# Patient Record
Sex: Male | Born: 1995 | Race: Black or African American | Hispanic: No | Marital: Single | Smoking: Current some day smoker
Health system: Southern US, Community
[De-identification: ages and names within clinical notes are randomized; demographics above are authoritative.]

## PROBLEM LIST (undated history)

## (undated) DIAGNOSIS — Z789 Other specified health status: Secondary | ICD-10-CM

---
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
## 2016-11-05 ENCOUNTER — Observation Stay (HOSPITAL_COMMUNITY)
Admission: EM | Admit: 2016-11-05 | Discharge: 2016-11-06 | Disposition: A | Discharge status: Home / Self Care | Payer: Medicaid Other | Attending: Internal Medicine | Admitting: Internal Medicine

## 2016-11-05 ENCOUNTER — Emergency Department (HOSPITAL_COMMUNITY): Payer: Medicaid Other

## 2016-11-05 ENCOUNTER — Encounter (HOSPITAL_COMMUNITY): Payer: Self-pay | Admitting: *Deleted

## 2016-11-05 DIAGNOSIS — F1910 Other psychoactive substance abuse, uncomplicated: Secondary | ICD-10-CM

## 2016-11-05 DIAGNOSIS — F1190 Opioid use, unspecified, uncomplicated: Secondary | ICD-10-CM | POA: Diagnosis present

## 2016-11-05 DIAGNOSIS — I959 Hypotension, unspecified: Secondary | ICD-10-CM | POA: Diagnosis present

## 2016-11-05 DIAGNOSIS — T50901A Poisoning by unspecified drugs, medicaments and biological substances, accidental (unintentional), initial encounter: Secondary | ICD-10-CM | POA: Diagnosis not present

## 2016-11-05 DIAGNOSIS — F1210 Cannabis abuse, uncomplicated: Secondary | ICD-10-CM

## 2016-11-05 DIAGNOSIS — R4182 Altered mental status, unspecified: Secondary | ICD-10-CM

## 2016-11-05 DIAGNOSIS — I952 Hypotension due to drugs: Secondary | ICD-10-CM | POA: Diagnosis not present

## 2016-11-05 LAB — CBC WITH DIFFERENTIAL/PLATELET
Basophils Absolute: 0 10*3/uL (ref 0.0–0.1)
Basophils Relative: 0 %
EOS PCT: 2 %
Eosinophils Absolute: 0.1 10*3/uL (ref 0.0–0.7)
HEMATOCRIT: 41 % (ref 39.0–52.0)
Hemoglobin: 14.2 g/dL (ref 13.0–17.0)
LYMPHS PCT: 26 %
Lymphs Abs: 1.5 10*3/uL (ref 0.7–4.0)
MCH: 32.1 pg (ref 26.0–34.0)
MCHC: 34.6 g/dL (ref 30.0–36.0)
MCV: 92.8 fL (ref 78.0–100.0)
MONO ABS: 0.5 10*3/uL (ref 0.1–1.0)
MONOS PCT: 8 %
NEUTROS ABS: 3.9 10*3/uL (ref 1.7–7.7)
Neutrophils Relative %: 64 %
Platelets: 164 10*3/uL (ref 150–400)
RBC: 4.42 MIL/uL (ref 4.22–5.81)
RDW: 12.2 % (ref 11.5–15.5)
WBC: 6 10*3/uL (ref 4.0–10.5)

## 2016-11-05 LAB — RAPID URINE DRUG SCREEN, HOSP PERFORMED
Amphetamines: NOT DETECTED
BARBITURATES: NOT DETECTED
Benzodiazepines: NOT DETECTED
Cocaine: NOT DETECTED
Opiates: POSITIVE — AB
TETRAHYDROCANNABINOL: POSITIVE — AB

## 2016-11-05 LAB — COMPREHENSIVE METABOLIC PANEL
ALBUMIN: 4.1 g/dL (ref 3.5–5.0)
ALT: 13 U/L — AB (ref 17–63)
AST: 15 U/L (ref 15–41)
Alkaline Phosphatase: 43 U/L (ref 38–126)
Anion gap: 6 (ref 5–15)
BILIRUBIN TOTAL: 0.3 mg/dL (ref 0.3–1.2)
BUN: 9 mg/dL (ref 6–20)
CHLORIDE: 105 mmol/L (ref 101–111)
CO2: 28 mmol/L (ref 22–32)
CREATININE: 0.87 mg/dL (ref 0.61–1.24)
Calcium: 8.7 mg/dL — ABNORMAL LOW (ref 8.9–10.3)
GFR calc Af Amer: >60 mL/min (ref >60)
GFR calc non Af Amer: >60 mL/min (ref >60)
GLUCOSE: 89 mg/dL (ref 65–99)
POTASSIUM: 3.6 mmol/L (ref 3.5–5.1)
Sodium: 139 mmol/L (ref 135–145)
Total Protein: 6.5 g/dL (ref 6.5–8.1)

## 2016-11-05 LAB — I-STAT CHEM 8, ED
BUN: 9 mg/dL (ref 6–20)
CALCIUM ION: 1.14 mmol/L — AB (ref 1.15–1.40)
Chloride: 103 mmol/L (ref 101–111)
Creatinine, Ser: 0.8 mg/dL (ref 0.61–1.24)
Glucose, Bld: 92 mg/dL (ref 65–99)
HCT: 41 % (ref 39.0–52.0)
HEMOGLOBIN: 13.9 g/dL (ref 13.0–17.0)
Potassium: 4.1 mmol/L (ref 3.5–5.1)
SODIUM: 141 mmol/L (ref 135–145)
TCO2: 31 mmol/L (ref 0–100)

## 2016-11-05 LAB — ACETAMINOPHEN LEVEL: Acetaminophen (Tylenol), Serum: <10 ug/mL — ABNORMAL LOW (ref 10–30)

## 2016-11-05 LAB — ETHANOL: Alcohol, Ethyl (B): <5 mg/dL (ref <5)

## 2016-11-05 LAB — I-STAT CG4 LACTIC ACID, ED: LACTIC ACID, VENOUS: 1 mmol/L (ref 0.5–1.9)

## 2016-11-05 LAB — SALICYLATE LEVEL

## 2016-11-05 MED ORDER — SODIUM CHLORIDE 0.9 % IV BOLUS (SEPSIS)
1000.0000 mL | Freq: Once | INTRAVENOUS | Status: AC
  Administered 2016-11-05: 1000 mL via INTRAVENOUS

## 2016-11-05 MED ORDER — NALOXONE HCL 2 MG/2ML IJ SOSY
PREFILLED_SYRINGE | INTRAMUSCULAR | Status: AC
  Administered 2016-11-05: 1 mg via INTRAVENOUS
  Filled 2016-11-05: qty 2

## 2016-11-05 MED ORDER — NALOXONE HCL 2 MG/2ML IJ SOSY
PREFILLED_SYRINGE | INTRAMUSCULAR | Status: AC
  Filled 2016-11-05: qty 2

## 2016-11-05 MED ORDER — ONDANSETRON HCL 4 MG/2ML IJ SOLN
INTRAMUSCULAR | Status: AC
  Filled 2016-11-05: qty 2

## 2016-11-05 MED ORDER — NALOXONE HCL 2 MG/2ML IJ SOSY
1.0000 mg | PREFILLED_SYRINGE | Freq: Once | INTRAMUSCULAR | Status: AC
  Administered 2016-11-05: 1 mg via INTRAVENOUS

## 2016-11-05 MED ORDER — NALOXONE HCL 2 MG/2ML IJ SOSY: 1.0000 mg | PREFILLED_SYRINGE | Freq: Once | INTRAMUSCULAR | Status: DC

## 2016-11-05 MED ORDER — ONDANSETRON HCL 4 MG/2ML IJ SOLN
4.0000 mg | Freq: Once | INTRAMUSCULAR | Status: AC
  Administered 2016-11-05: 4 mg via INTRAVENOUS

## 2016-11-05 MED ORDER — SODIUM CHLORIDE 0.9 % IV BOLUS (SEPSIS)
500.0000 mL | Freq: Once | INTRAVENOUS | Status: AC
  Administered 2016-11-05: 500 mL via INTRAVENOUS

## 2016-11-05 MED ORDER — NALOXONE HCL 2 MG/2ML IJ SOSY
2.0000 mg | PREFILLED_SYRINGE | Freq: Once | INTRAMUSCULAR | Status: AC
  Administered 2016-11-05: 2 mg via INTRAVENOUS
  Filled 2016-11-05: qty 2

## 2016-11-05 NOTE — H&P (Signed)
History and Physical    Anthony May NOI:[id number] DOB: 10/25/1995 DOA: 11/05/2016  PCP: Patient, No Pcp Per  Patient coming from:  Home.    Chief Complaint:   Accidental drug overdose.  HPI: Anthony May is an 21 y.o. male brought in via EMS unresponsive.  Mother reported that his friends brought him to her and thought he needs to "sleep it off".   In the ER, he was watched for several hours, and intermittently he would wake up transiently for a few minutes, confused, and was surprised when he was told of what had happened.  He denied suicidal overdose.  His BP was noted to be soft.  Narcan 0.4 mg was given with minimal response.  He maintained his Sat and RR, but BP was soft.  Work up included a normal electrolytes, UDS positive for opiates and THC, tylenol and ASA was negative.  Alcohol level was negative.  Hospitalist was asked to admit him for further observation.    ED Course:  See above.  Rewiew of Systems:   Unable.   Past Medical History:  Diagnosis Date  . Medical history non-contributory     Past Surgical History:  Procedure Laterality Date  . CLOSED REDUCTION MANDIBLE WITH MANDIBULOMA N/A 10/07/2015   Procedure: Shelbie Ammons  FIXATION;  Surgeon: Osborn Coho, MD;  Location: [facility] OR;  Service: ENT;  Laterality: N/A;  . MANDIBULAR HARDWARE REMOVAL N/A 11/17/2015   Procedure: MANDIBULAR HARDWARE REMOVAL, removal MMF wires and screws;  Surgeon: Osborn Coho, MD;  Location: [facility];  Service: ENT;  Laterality: N/A;  . ORIF MANDIBULAR FRACTURE N/A 09/16/2015   Procedure: OPEN REDUCTION INTERNAL FIXATION (ORIF) MANDIBULAR FRACTURE;  Surgeon: Osborn Coho, MD;  Location: [facility] OR;  Service: ENT;  Laterality: N/A;     reports that he has been smoking Cigarettes.  He has never used smokeless tobacco. He reports that he does not drink alcohol or use drugs.  No Known Allergies  Family History  Problem Relation Age of Onset  . Adopted: Yes  . Renal  Disease Father      Prior to Admission medications   Not on File    Physical Exam: Vitals:   11/05/16 1730 11/05/16 1800 11/05/16 1916 11/05/16 1919  BP: (!) 94/43 105/61  (!) 98/52  Pulse: 71 (!) 107  66  Resp: (!) 7 18  12   Temp:      TempSrc:      SpO2: 100% 98% 100% 100%  Weight:      Height:          Constitutional: NAD, calm, comfortable Vitals:   11/05/16 1730 11/05/16 1800 11/05/16 1916 11/05/16 1919  BP: (!) 94/43 105/61  (!) 98/52  Pulse: 71 (!) 107  66  Resp: (!) 7 18  12   Temp:      TempSrc:      SpO2: 100% 98% 100% 100%  Weight:      Height:       Eyes: PERRL, lids and conjunctivae normal ENMT: Mucous membranes are moist. Posterior pharynx clear of any exudate or lesions.Normal dentition.  Neck: normal, supple, no masses, no thyromegaly Respiratory: clear to auscultation bilaterally, no wheezing, no crackles. Normal respiratory effort. No accessory muscle use.  Cardiovascular: Regular rate and rhythm, no murmurs / rubs / gallops. No extremity edema. 2+ pedal pulses. No carotid bruits.  Abdomen: no tenderness, no masses palpated. No hepatosplenomegaly. Bowel sounds positive.  Musculoskeletal: no clubbing / cyanosis. No joint deformity upper  and lower extremities. Good ROM, no contractures. Normal muscle tone.  Skin: no rashes, lesions, ulcers. No induration Neurologic: lethargic.  Intermittently wake up, then fall back to sleep.  Psychiatric: unable.    Labs on Admission: I have personally reviewed following labs and imaging studies  CBC:  Recent Labs Lab 11/05/16 1444 11/05/16 1501  WBC 6.0  --   NEUTROABS 3.9  --   HGB 14.2 13.9  HCT 41.0 41.0  MCV 92.8  --   PLT 164  --    Basic Metabolic Panel:  Recent Labs Lab 11/05/16 1444 11/05/16 1501  NA 139 141  K 3.6 4.1  CL 105 103  CO2 28  --   GLUCOSE 89 92  BUN 9 9  CREATININE 0.87 0.80  CALCIUM 8.7*  --    GFR: Estimated Creatinine Clearance: 98.1 mL/min (by C-G formula based on  SCr of 0.8 mg/dL). Liver Function Tests:  Recent Labs Lab 11/05/16 1444  AST 15  ALT 13*  ALKPHOS 43  BILITOT 0.3  PROT 6.5  ALBUMIN 4.1   Radiological Exams on Admission: Ct Head Wo Contrast  Result Date: 11/05/2016 CLINICAL DATA:  Altered level of consciousness. EXAM: CT HEAD WITHOUT CONTRAST TECHNIQUE: Contiguous axial images were obtained from the base of the skull through the vertex without intravenous contrast. COMPARISON:  None. FINDINGS: Brain: No evidence of acute infarction, hemorrhage, hydrocephalus, extra-axial collection or mass lesion/mass effect. Vascular: No hyperdense vessel or unexpected calcification. Skull: Normal. Negative for fracture or focal lesion. Sinuses/Orbits: No acute finding. Other: None. IMPRESSION: 1. No acute intracranial abnormalities.  Normal brain. Electronically Signed   By: Signa Kell M.D.   On: 11/05/2016 16:15   Dg Chest Portable 1 View  Result Date: 11/05/2016 CLINICAL DATA:  Altered mental status. EXAM: PORTABLE CHEST 1 VIEW COMPARISON:  None. FINDINGS: Monitoring leads overlie the patient. Normal cardiac and mediastinal contours. No consolidative pulmonary opacities. No pleural effusion or pneumothorax. IMPRESSION: No acute cardiopulmonary process. Electronically Signed   By: Annia Belt M.D.   On: 11/05/2016 14:55    EKG: Independently reviewed.   Assessment/Plan Principal Problem:   Accidental drug overdose Active Problems:   Hypotension   PLAN:   Accidental drug overdose:  I suspect he uses a long acting narcotics, or was exposed to an adulterant with sedative side effect.  Will give a higher dose of Narcan, and admit him to the SDU for further observation.  Give IVF.  Will give supportive care, and pressor if required.  History did not suggest intentional overdose.     DVT prophylaxis: SCD.  Code Status:  FULL CODE Family Communication: None at bedside.  Disposition Plan: Home.  Consults called: None  Admission status: OBS  in ICU>    Melaysia Streed MD FACP. [facility]  If 7PM-7AM, please contact night-coverage [email or web address] Password [facility]  11/05/2016, 9:26 PM

## 2016-11-05 NOTE — ED Notes (Addendum)
Pt occasionally moves in the bed and opens his eyes. Pt won't talk to nurse at this time.

## 2016-11-05 NOTE — ED Notes (Signed)
ED Provider at bedside. 

## 2016-11-05 NOTE — ED Triage Notes (Signed)
Pt was dropped off at mothers house and laid patient on the bed. Told them,  "he needed to walk it off." Friends left, pt wouldn't respond to mother so she called to ems. Pt is not alert at this time. EDP Pickering at bedside.

## 2016-11-05 NOTE — ED Provider Notes (Addendum)
Pt seen by Dr Rubin Payor earlier.  Presents with probable intoxication from some illicit drug.  Drug screen is positive for opiates and marijuana.  No response to narcan earlier.  Will continue to monitor.  Pt appears stable.   Linwood Dibbles, MD 11/05/16 1633  Still very somnolent.  Responds to painful stimuli but does not speak. BP is borderline.  Will try a dose of narcan.   Linwood Dibbles, MD 11/05/16 1738  Pt did have much of a response to narcan.  He still has not woken up and spoken to me.  He will grimace and roll over when I do a sternal rub.  BP remains soft.  I will consult with medical service for admission, continued monitoring.   Linwood Dibbles, MD 11/05/16 2019  Pt now will wake up after speaking to him loudly for several minutes.  He answered a few questions.  Denies using drugs but then goes back to sleep. Discussed with Dr Nedra Hai about admission. We will try watching him for a few hours longer since he does wake up.   Linwood Dibbles, MD 11/05/16 2039

## 2016-11-05 NOTE — ED Notes (Signed)
Tried to arouse patient, patient will awake for a few seconds and dose back off to sleep.

## 2016-11-05 NOTE — ED Notes (Signed)
MD made aware about low BP.

## 2016-11-05 NOTE — ED Notes (Signed)
Pt vomited several times. Pt had eyes open and would make eye contact when vomiting. Pt was suctioned several times. Airway was patent throughout time.    Mother is at bedside

## 2016-11-05 NOTE — ED Provider Notes (Signed)
[facility]-EMERGENCY DEPT Provider Note   CSN: [id number] Arrival date & time: 11/05/16  1435     History   Chief Complaint Chief Complaint  Patient presents with  . Loss of Consciousness    HPI Anthony May is a 21 y.o. male. Level 5 caveat due to altered mental status. HPI Patient brought in by EMS. Reportedly she was dropped off at his mother's house by 2 friends saying he needed to "sleep it off". Patient is minimally responsive. Unknown what he drank or took. Vitals reassuring but has decreased responsiveness.   Past Medical History:  Diagnosis Date  . Medical history non-contributory     Patient Active Problem List   Diagnosis Date Noted  . Mandible fracture (HCC) 09/16/2015    Past Surgical History:  Procedure Laterality Date  . CLOSED REDUCTION MANDIBLE WITH MANDIBULOMA N/A 10/07/2015   Procedure: Shelbie Ammons  FIXATION;  Surgeon: Osborn Coho, MD;  Location: [facility] OR;  Service: ENT;  Laterality: N/A;  . MANDIBULAR HARDWARE REMOVAL N/A 11/17/2015   Procedure: MANDIBULAR HARDWARE REMOVAL, removal MMF wires and screws;  Surgeon: Osborn Coho, MD;  Location: [facility];  Service: ENT;  Laterality: N/A;  . ORIF MANDIBULAR FRACTURE N/A 09/16/2015   Procedure: OPEN REDUCTION INTERNAL FIXATION (ORIF) MANDIBULAR FRACTURE;  Surgeon: Osborn Coho, MD;  Location: [facility] OR;  Service: ENT;  Laterality: N/A;       Home Medications    Prior to Admission medications   Not on File    Family History Family History  Problem Relation Age of Onset  . Adopted: Yes  . Renal Disease Father     Social History Social History  Substance Use Topics  . Smoking status: Current Some Day Smoker    Types: Cigarettes  . Smokeless tobacco: Never Used  . Alcohol use No     Allergies   Patient has no known allergies.   Review of Systems Review of Systems  Unable to perform ROS: Mental status change     Physical Exam Updated Vital Signs BP 127/87 (BP  Location: Left Arm)   Pulse 97   Temp 99.2 F (37.3 C) (Rectal)   Resp 18   Ht 5\' 5"  (1.651 m)   Wt 46.7 kg (103 lb)   SpO2 100%   BMI 17.14 kg/m   Physical Exam  Constitutional: He appears well-developed.  HENT:  Head: Atraumatic.  Eyes:  Pupils midpoint. Somewhat sluggish. Does have some repetitive movements looking to left and right corneal reflexes intact.  Neck: Neck supple.  Cardiovascular: Normal rate.   Pulmonary/Chest: No respiratory distress.  Abdominal: There is no tenderness.  Musculoskeletal: He exhibits no tenderness.  Neurological:  Patient is nonresponsive and nonverbal. Repetitive eye movements but don't seizing. Will withdraw to pain on his upper extremities. Breathing spontaneously. Would not allow tongue depressor into his posterior pharynx and remained clamped down on it. Has clonus in bilateral ankles.  Skin: Skin is warm.     ED Treatments / Results  Labs (all labs ordered are listed, but only abnormal results are displayed) Labs Reviewed  I-STAT CHEM 8, ED - Abnormal; Notable for the following:       Result Value   Calcium, Ion 1.14 (*)    All other components within normal limits  COMPREHENSIVE METABOLIC PANEL  ETHANOL  CBC WITH DIFFERENTIAL/PLATELET  ACETAMINOPHEN LEVEL  RAPID URINE DRUG SCREEN, HOSP PERFORMED  SALICYLATE LEVEL  I-STAT CG4 LACTIC ACID, ED    EKG  EKG Interpretation  Date/Time:  Monday November 05 2016 14:43:14 EDT Ventricular Rate:  89 PR Interval:    QRS Duration: 92 QT Interval:  329 QTC Calculation: 401 R Axis:   9 Text Interpretation:  Sinus rhythm RSR' in V1 or V2, probably normal variant Confirmed by Benjiman Core [phone]) on 11/05/2016 2:53:06 PM       Radiology Dg Chest Portable 1 View  Result Date: 11/05/2016 CLINICAL DATA:  Altered mental status. EXAM: PORTABLE CHEST 1 VIEW COMPARISON:  None. FINDINGS: Monitoring leads overlie the patient. Normal cardiac and mediastinal contours. No consolidative  pulmonary opacities. No pleural effusion or pneumothorax. IMPRESSION: No acute cardiopulmonary process. Electronically Signed   By: Annia Belt M.D.   On: 11/05/2016 14:55    Procedures Procedures (including critical care time)  Medications Ordered in ED Medications  sodium chloride 0.9 % bolus 500 mL (0 mLs Intravenous Stopped 11/05/16 1525)  naloxone (NARCAN) injection 1 mg (1 mg Intravenous Given 11/05/16 1454)  ondansetron (ZOFRAN) injection 4 mg (4 mg Intravenous Given 11/05/16 1526)     Initial Impression / Assessment and Plan / ED Course  I have reviewed the triage vital signs and the nursing notes.  Pertinent labs & imaging results that were available during my care of the patient were reviewed by me and considered in my medical decision making (see chart for details).     Patient altered mental status. May be substance related. Breathing spontaneously but did and no vomiting. No response to Narcan. Labs pending. Discussed with mother and unknown if there was an ingestion. Informed her possibility of needing intubation of airway protection comes warfarin issue. Care turned over to Dr.Knapp.  Final Clinical Impressions(s) / ED Diagnoses   Final diagnoses:  Altered mental status, unspecified altered mental status type    New Prescriptions New Prescriptions   No medications on file     Benjiman Core, MD 11/05/16 1527

## 2016-11-05 NOTE — ED Notes (Signed)
Pt's mother left contact number:  Luster Landsberg  [phone]

## 2016-11-05 NOTE — ED Notes (Signed)
Pt had no reaction to narcan.

## 2016-11-05 NOTE — ED Notes (Signed)
Pt awake for a period of approx 2 minutes. Pt verbalized need for a urinal. VS stable.

## 2016-11-06 DIAGNOSIS — F1190 Opioid use, unspecified, uncomplicated: Secondary | ICD-10-CM | POA: Diagnosis not present

## 2016-11-06 DIAGNOSIS — F1910 Other psychoactive substance abuse, uncomplicated: Secondary | ICD-10-CM | POA: Diagnosis not present

## 2016-11-06 DIAGNOSIS — I952 Hypotension due to drugs: Secondary | ICD-10-CM | POA: Diagnosis not present

## 2016-11-06 DIAGNOSIS — F1210 Cannabis abuse, uncomplicated: Secondary | ICD-10-CM

## 2016-11-06 DIAGNOSIS — R4182 Altered mental status, unspecified: Secondary | ICD-10-CM | POA: Diagnosis not present

## 2016-11-06 LAB — COMPREHENSIVE METABOLIC PANEL
ALK PHOS: 41 U/L (ref 38–126)
ALT: 50 U/L (ref 17–63)
AST: 40 U/L (ref 15–41)
Albumin: 3.2 g/dL — ABNORMAL LOW (ref 3.5–5.0)
Anion gap: 3 — ABNORMAL LOW (ref 5–15)
BUN: 8 mg/dL (ref 6–20)
CALCIUM: 8.1 mg/dL — AB (ref 8.9–10.3)
CO2: 28 mmol/L (ref 22–32)
CREATININE: 0.8 mg/dL (ref 0.61–1.24)
Chloride: 109 mmol/L (ref 101–111)
Glucose, Bld: 105 mg/dL — ABNORMAL HIGH (ref 65–99)
Potassium: 3.6 mmol/L (ref 3.5–5.1)
SODIUM: 140 mmol/L (ref 135–145)
Total Bilirubin: 0.5 mg/dL (ref 0.3–1.2)
Total Protein: 5.2 g/dL — ABNORMAL LOW (ref 6.5–8.1)

## 2016-11-06 LAB — CBC
HCT: 37.3 % — ABNORMAL LOW (ref 39.0–52.0)
Hemoglobin: 12.9 g/dL — ABNORMAL LOW (ref 13.0–17.0)
MCH: 32 pg (ref 26.0–34.0)
MCHC: 34.6 g/dL (ref 30.0–36.0)
MCV: 92.6 fL (ref 78.0–100.0)
PLATELETS: 154 10*3/uL (ref 150–400)
RBC: 4.03 MIL/uL — AB (ref 4.22–5.81)
RDW: 12.4 % (ref 11.5–15.5)
WBC: 5.3 10*3/uL (ref 4.0–10.5)

## 2016-11-06 LAB — MRSA PCR SCREENING: MRSA by PCR: NEGATIVE

## 2016-11-06 MED ORDER — DEXTROSE-NACL 5-0.9 % IV SOLN
INTRAVENOUS | Status: DC
  Administered 2016-11-06: 08:00:00 via INTRAVENOUS

## 2016-11-06 NOTE — Discharge Summary (Signed)
Physician Discharge Summary  Anthony May ZOX:[id number] DOB: [birth year] DOA: 11/05/2016  PCP: Patient, No Pcp Per  Admit date: 11/05/2016 Discharge date: 11/06/2016  Admitted From: (Home) Disposition:  (Home)  Recommendations for Outpatient Follow-up:  1. Follow up with PCP as needed . 2.   Home Health: (NO) Equipment/Devices:(none)  Discharge Condition:(Stable)  CODE STATUS (FULL)  Diet recommendation: regular    Brief/Interim Summary:   21 y.o. male brought in via EMS unresponsive.  Mother reported that his friends brought him to her and thought he needs to "sleep it off".   In the ER, he was watched for several hours, and intermittently he would wake up transiently for a few minutes, confused, and was surprised when he was told of what had happened.  He denied suicidal overdose.  His BP was noted to be soft.  Narcan 0.4 mg was given with minimal response.  He maintained his Sat and RR, but BP was soft.  Work up included a normal electrolytes, UDS positive for opiates and THC, tylenol and ASA was negative.  Alcohol level was negative. Admitted to the ICU for close obs with complete resolution of his symptoms ,BP was stabilized ,  stable to be discharged and go home . He was advised about the importance to quit doing the drugs for better outcome .    Discharge Diagnoses:     Accidental drug overdose   Hypotension    Discharge Instructions  Discharge Instructions    Diet general    Complete by:  As directed    Increase activity slowly    Complete by:  As directed      Allergies as of 11/06/2016   No Known Allergies     Medication List    You have not been prescribed any medications.     No Known Allergies  Consultations: None   Procedures/Studies: Ct Head Wo Contrast  Result Date: 11/05/2016 CLINICAL DATA:  Altered level of consciousness. EXAM: CT HEAD WITHOUT CONTRAST TECHNIQUE: Contiguous axial images were obtained from the base of the skull through  the vertex without intravenous contrast. COMPARISON:  None. FINDINGS: Brain: No evidence of acute infarction, hemorrhage, hydrocephalus, extra-axial collection or mass lesion/mass effect. Vascular: No hyperdense vessel or unexpected calcification. Skull: Normal. Negative for fracture or focal lesion. Sinuses/Orbits: No acute finding. Other: None. IMPRESSION: 1. No acute intracranial abnormalities.  Normal brain. Electronically Signed   By: Signa Kell M.D.   On: 11/05/2016 16:15   Dg Chest Portable 1 View  Result Date: 11/05/2016 CLINICAL DATA:  Altered mental status. EXAM: PORTABLE CHEST 1 VIEW COMPARISON:  None. FINDINGS: Monitoring leads overlie the patient. Normal cardiac and mediastinal contours. No consolidative pulmonary opacities. No pleural effusion or pneumothorax. IMPRESSION: No acute cardiopulmonary process. Electronically Signed   By: Annia Belt M.D.   On: 11/05/2016 14:55    (Echo, Carotid, EGD, Colonoscopy, ERCP)    Subjective:   Discharge Exam: Vitals:   11/06/16 0800 11/06/16 0820  BP: 107/80   Pulse: (!) 51 (!) 54  Resp: 18 19  Temp:    SpO2: 100% 99%   Vitals:   11/06/16 0600 11/06/16 0700 11/06/16 0800 11/06/16 0820  BP: 104/64 115/69 107/80   Pulse: (!) 54 60 (!) 51 (!) 54  Resp: 15 16 18 19   Temp:      TempSrc:      SpO2: 98% 100% 100% 99%  Weight:      Height:        General: Pt  is alert, awake, not in acute distress Cardiovascular: RRR, S1/S2 +, no rubs, no gallops Respiratory: CTA bilaterally, no wheezing, no rhonchi Abdominal: Soft, NT, ND, bowel sounds + Extremities: no edema, no cyanosis    The results of significant diagnostics from this hospitalization (including imaging, microbiology, ancillary and laboratory) are listed below for reference.     Microbiology: No results found for this or any previous visit (from the past 240 hour(s)).   Labs: BNP (last 3 results) No results for input(s): BNP in the last 8760 hours. Basic Metabolic  Panel:  Recent Labs Lab 11/05/16 1444 11/05/16 1501 11/06/16 0548  NA 139 141 140  K 3.6 4.1 3.6  CL 105 103 109  CO2 28  --  28  GLUCOSE 89 92 105*  BUN 9 9 8   CREATININE 0.87 0.80 0.80  CALCIUM 8.7*  --  8.1*   Liver Function Tests:  Recent Labs Lab 11/05/16 1444 11/06/16 0548  AST 15 40  ALT 13* 50  ALKPHOS 43 41  BILITOT 0.3 0.5  PROT 6.5 5.2*  ALBUMIN 4.1 3.2*   No results for input(s): LIPASE, AMYLASE in the last 168 hours. No results for input(s): AMMONIA in the last 168 hours. CBC:  Recent Labs Lab 11/05/16 1444 11/05/16 1501 11/06/16 0548  WBC 6.0  --  5.3  NEUTROABS 3.9  --   --   HGB 14.2 13.9 12.9*  HCT 41.0 41.0 37.3*  MCV 92.8  --  92.6  PLT 164  --  154   Cardiac Enzymes: No results for input(s): CKTOTAL, CKMB, CKMBINDEX, TROPONINI in the last 168 hours. BNP: Invalid input(s): POCBNP CBG: No results for input(s): GLUCAP in the last 168 hours. D-Dimer No results for input(s): DDIMER in the last 72 hours. Hgb A1c No results for input(s): HGBA1C in the last 72 hours. Lipid Profile No results for input(s): CHOL, HDL, LDLCALC, TRIG, CHOLHDL, LDLDIRECT in the last 72 hours. Thyroid function studies No results for input(s): TSH, T4TOTAL, T3FREE, THYROIDAB in the last 72 hours.  Invalid input(s): FREET3 Anemia work up No results for input(s): VITAMINB12, FOLATE, FERRITIN, TIBC, IRON, RETICCTPCT in the last 72 hours. Urinalysis No results found for: COLORURINE, APPEARANCEUR, LABSPEC, PHURINE, GLUCOSEU, HGBUR, BILIRUBINUR, KETONESUR, PROTEINUR, UROBILINOGEN, NITRITE, LEUKOCYTESUR Sepsis Labs Invalid input(s): PROCALCITONIN,  WBC,  LACTICIDVEN Microbiology No results found for this or any previous visit (from the past 240 hour(s)).   Time coordinating discharge: Over 30 minutes  SIGNED:   Efrain Sella, MD  [facility] 11/06/2016, 8:54 AM Pager   If 7PM-7AM, please contact night-coverage [email or web address] Password [email or web address]

## 2016-11-06 NOTE — Progress Notes (Signed)
Pt discharged home; instructions given to pt. Understanding verbalized. PIV removed with no complications. VSS. Pt left with all belongings.

## 2016-11-07 LAB — HIV ANTIBODY (ROUTINE TESTING W REFLEX): HIV Screen 4th Generation wRfx: NONREACTIVE

## 2017-09-01 IMAGING — CT CT MAXILLOFACIAL W/O CM
3 series · 16 of 47 positions shown, 19 images · non-contrast
Comparison: None.

CLINICAL DATA: Fell, striking face on a tree stump tonight.

EXAM:
CT MAXILLOFACIAL WITHOUT CONTRAST
TECHNIQUE: Multidetector CT imaging of the maxillofacial structures was
performed. Multiplanar CT image reconstructions were also generated.
A small metallic BB was placed on the right temple in order to
reliably differentiate right from left.

[Series 2: max soft · axial · 0.38mm/px · z∈[+1170,+1300]mm · 10 of 77 slices shown, 13 images]
[im 6/77  brain]
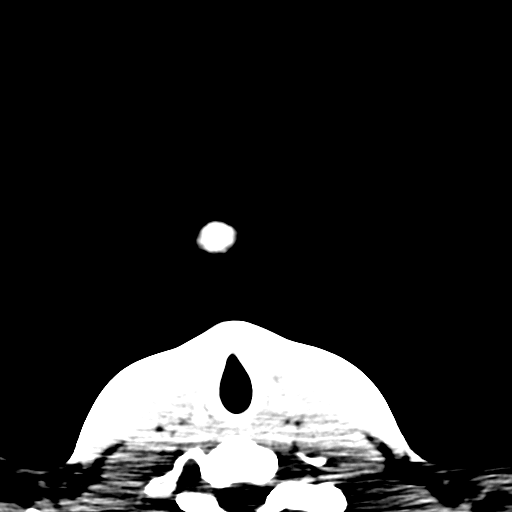
[im 6/77  bone]
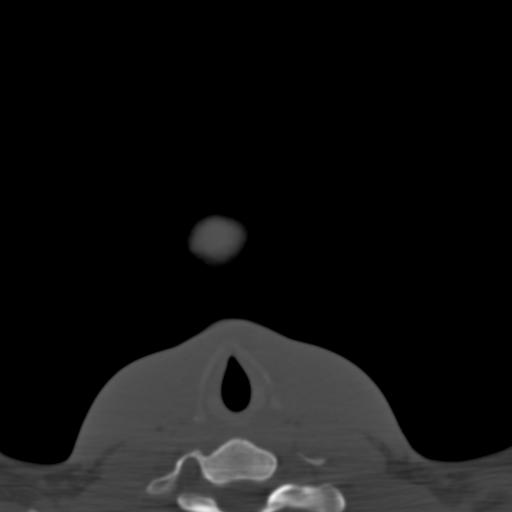
[im 14/77  bone]
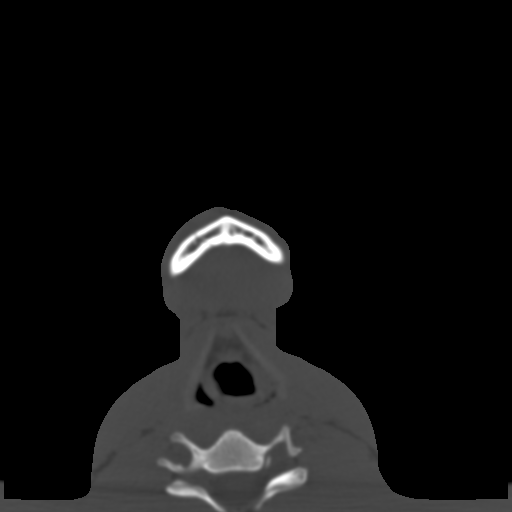
[im 21/77  bone]
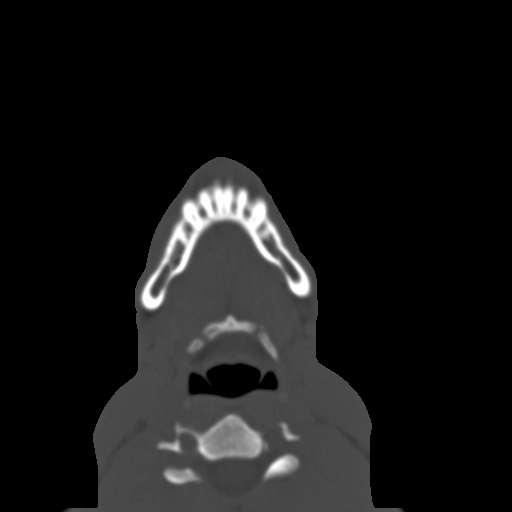
[im 27/77  bone]
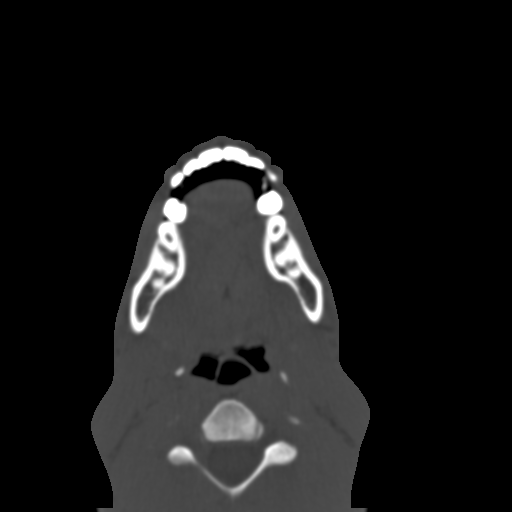
[im 35/77  brain]
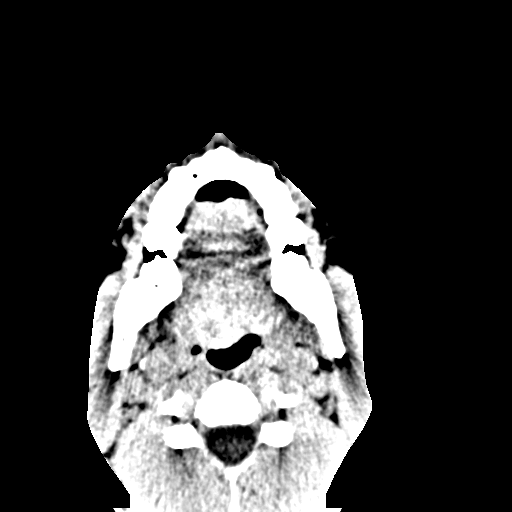
[im 35/77  bone]
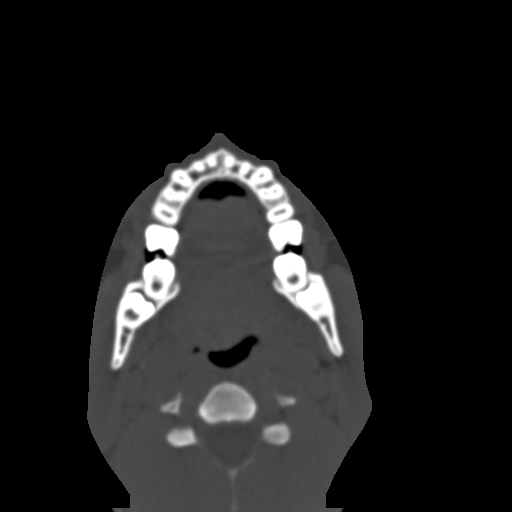
[im 42/77  bone]
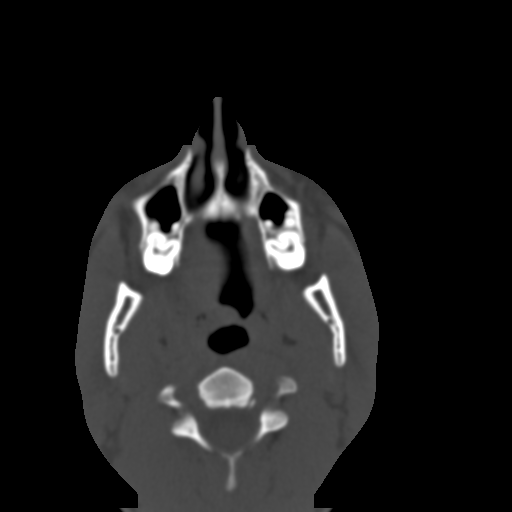
[im 50/77  bone]
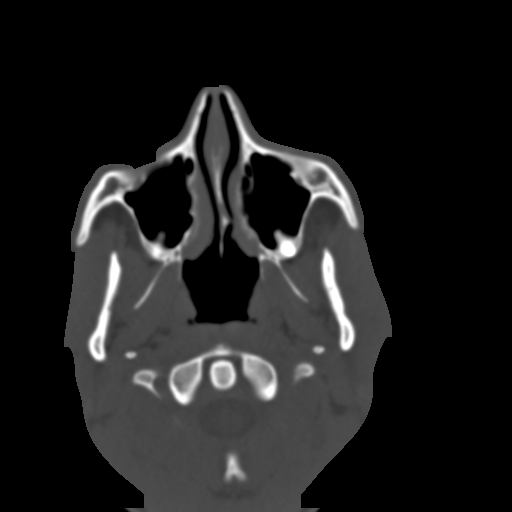
[im 58/77  bone]
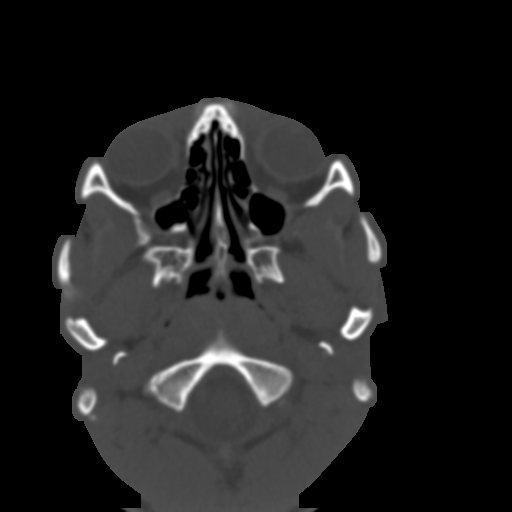
[im 63/77  brain]
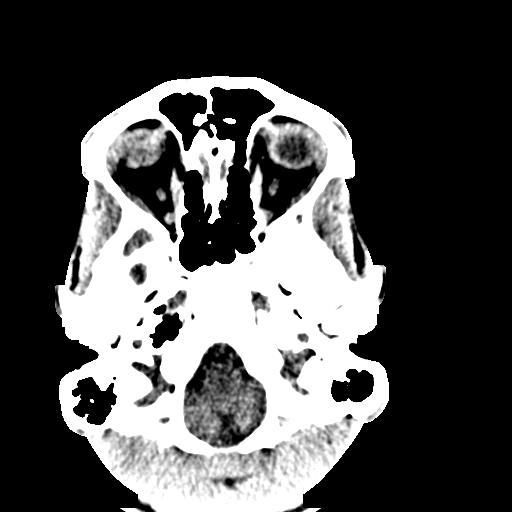
[im 63/77  bone]
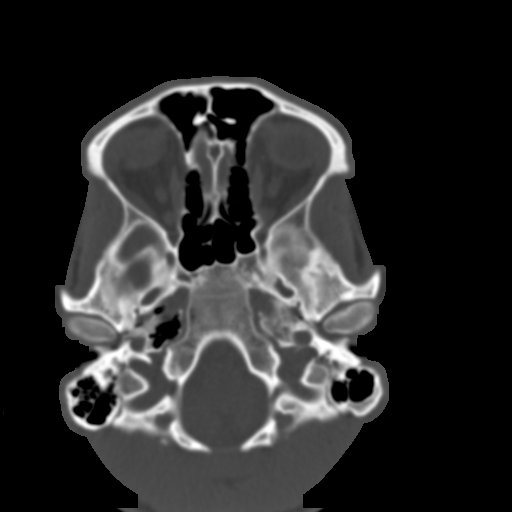
[im 71/77  bone]
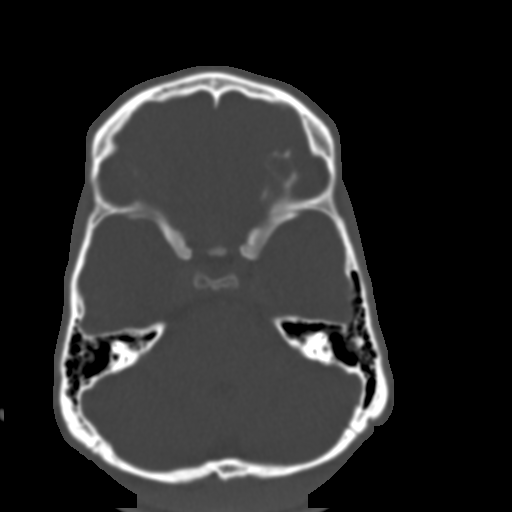

[Series 4: sagittal soft sag · sagittal · 0.32mm/px · 3 of 81 slices shown]
[im 27/81  bone]
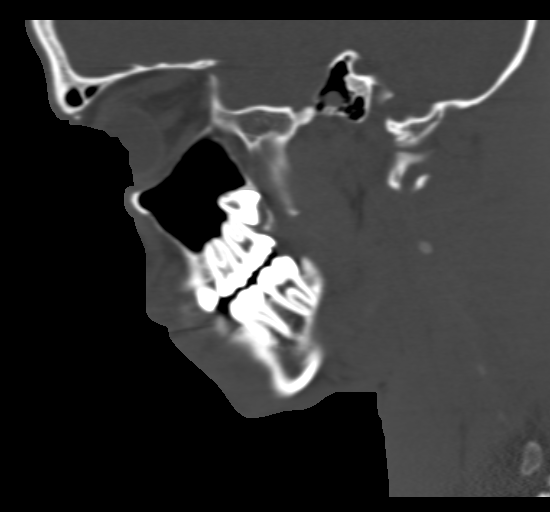
[im 41/81  bone]
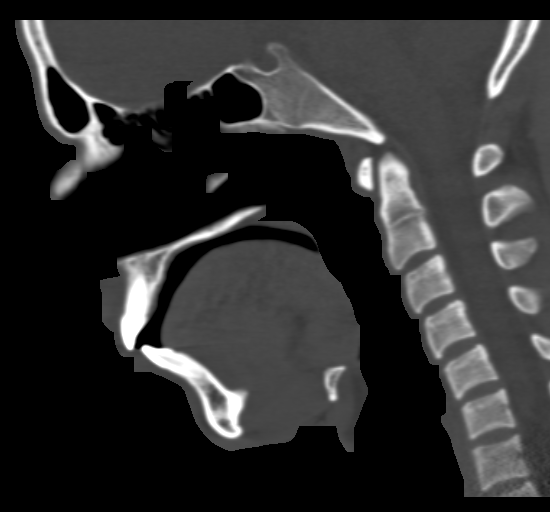
[im 54/81  bone]
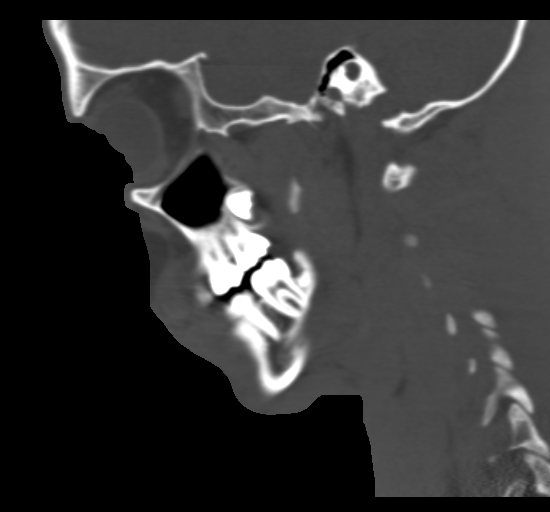

[Series 5: coronal soft cor · coronal · 0.32mm/px · 3 of 81 slices shown]
[im 27/81  bone]
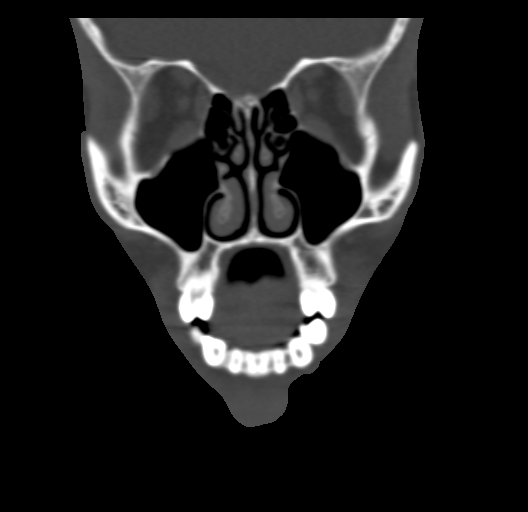
[im 36/81  bone]
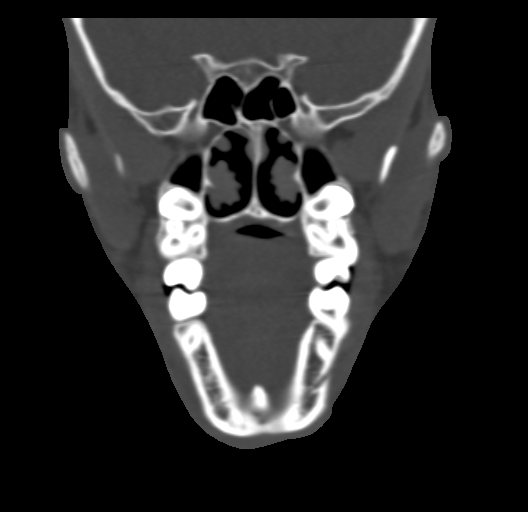
[im 45/81  bone]
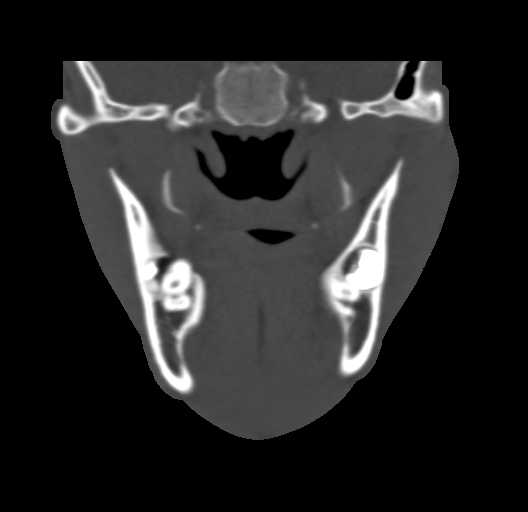

[16 of 47 positions shown; findings below may reference images not displayed]

FINDINGS: There is an acute nondisplaced fracture of the mandible in the right
parasymphyseal region. TMJ articulations are intact. No other acute
fractures are evident. Maxillary sinuses are intact. Orbits are
intact. Pterygoid plates and zygomatic arches are intact.

No soft tissue foreign body or soft tissue gas is evident.
IMPRESSION: Nondisplaced fracture of the right parasymphyseal mandible.

## 2018-01-30 ENCOUNTER — Encounter (HOSPITAL_COMMUNITY): Payer: Self-pay

## 2018-01-30 ENCOUNTER — Other Ambulatory Visit: Payer: Self-pay

## 2018-01-30 ENCOUNTER — Emergency Department (HOSPITAL_COMMUNITY)
Admission: EM | Admit: 2018-01-30 | Discharge: 2018-01-30 | Disposition: A | Discharge status: Home / Self Care | Payer: Self-pay | Attending: Emergency Medicine | Admitting: Emergency Medicine

## 2018-01-30 DIAGNOSIS — F17210 Nicotine dependence, cigarettes, uncomplicated: Secondary | ICD-10-CM | POA: Insufficient documentation

## 2018-01-30 DIAGNOSIS — T50901A Poisoning by unspecified drugs, medicaments and biological substances, accidental (unintentional), initial encounter: Secondary | ICD-10-CM | POA: Insufficient documentation

## 2018-01-30 LAB — CBC WITH DIFFERENTIAL/PLATELET
Abs Immature Granulocytes: 0.07 10*3/uL (ref 0.00–0.07)
BASOS ABS: 0.1 10*3/uL (ref 0.0–0.1)
BASOS PCT: 1 %
EOS ABS: 0 10*3/uL (ref 0.0–0.5)
EOS PCT: 1 %
HCT: 46.2 % (ref 39.0–52.0)
Hemoglobin: 15.7 g/dL (ref 13.0–17.0)
IMMATURE GRANULOCYTES: 1 %
LYMPHS ABS: 1.3 10*3/uL (ref 0.7–4.0)
Lymphocytes Relative: 16 %
MCH: 31 pg (ref 26.0–34.0)
MCHC: 34 g/dL (ref 30.0–36.0)
MCV: 91.1 fL (ref 80.0–100.0)
Monocytes Absolute: 0.4 10*3/uL (ref 0.1–1.0)
Monocytes Relative: 5 %
NRBC: 0 % (ref 0.0–0.2)
Neutro Abs: 6.4 10*3/uL (ref 1.7–7.7)
Neutrophils Relative %: 76 %
PLATELETS: 224 10*3/uL (ref 150–400)
RBC: 5.07 MIL/uL (ref 4.22–5.81)
RDW: 11.9 % (ref 11.5–15.5)
WBC: 8.3 10*3/uL (ref 4.0–10.5)

## 2018-01-30 LAB — COMPREHENSIVE METABOLIC PANEL
ALBUMIN: 4.9 g/dL (ref 3.5–5.0)
ALT: 31 U/L (ref 0–44)
AST: 35 U/L (ref 15–41)
Alkaline Phosphatase: 61 U/L (ref 38–126)
Anion gap: 9 (ref 5–15)
BILIRUBIN TOTAL: 0.7 mg/dL (ref 0.3–1.2)
BUN: 6 mg/dL (ref 6–20)
CHLORIDE: 106 mmol/L (ref 98–111)
CO2: 25 mmol/L (ref 22–32)
Calcium: 9.1 mg/dL (ref 8.9–10.3)
Creatinine, Ser: 0.9 mg/dL (ref 0.61–1.24)
GFR calc Af Amer: >60 mL/min (ref >60)
GFR calc non Af Amer: >60 mL/min (ref >60)
GLUCOSE: 96 mg/dL (ref 70–99)
POTASSIUM: 3.6 mmol/L (ref 3.5–5.1)
SODIUM: 140 mmol/L (ref 135–145)
TOTAL PROTEIN: 8.2 g/dL — AB (ref 6.5–8.1)

## 2018-01-30 LAB — ETHANOL: Alcohol, Ethyl (B): 11 mg/dL — ABNORMAL HIGH (ref <10)

## 2018-01-30 LAB — RAPID URINE DRUG SCREEN, HOSP PERFORMED
AMPHETAMINES: NOT DETECTED
BARBITURATES: NOT DETECTED
BENZODIAZEPINES: NOT DETECTED
Cocaine: POSITIVE — AB
Opiates: POSITIVE — AB
Tetrahydrocannabinol: POSITIVE — AB

## 2018-01-30 NOTE — Discharge Instructions (Addendum)
Do not use any more drugs and [facility] with [facility]

## 2018-01-30 NOTE — ED Provider Notes (Signed)
[facility] EMERGENCY DEPARTMENT Provider Note   CSN: [id number] Arrival date & time: 01/30/18  1847     History   Chief Complaint Chief Complaint  Patient presents with  . Drug Overdose    HPI Anthony May is a 22 y.o. male.  Patient was in the bed with his girlfriend and became unresponsive.  She called 911 because he would not respond.  She stated he was making a gurgling sound with his mouth.  The rescue team arrived they gave him Narcan and he eventually came around.  Patient states he was not trying to hurt himself  The history is provided by the patient and a relative. No language interpreter was used.  Drug Overdose  This is a new problem. The current episode started less than 1 hour ago. The problem occurs rarely. The problem has been resolved. Pertinent negatives include no chest pain, no abdominal pain and no headaches. Nothing aggravates the symptoms. Nothing relieves the symptoms. He has tried nothing for the symptoms. The treatment provided no relief.    Past Medical History:  Diagnosis Date  . Medical history non-contributory     Patient Active Problem List   Diagnosis Date Noted  . Accidental drug overdose 11/05/2016  . Hypotension 11/05/2016  . Mandible fracture (HCC) 09/16/2015    Past Surgical History:  Procedure Laterality Date  . CLOSED REDUCTION MANDIBLE WITH MANDIBULOMA N/A 10/07/2015   Procedure: [name];  Surgeon: Osborn Cohoavid Shoemaker, MD;  [facility];  Service: ENT;  Laterality: N/A;  . MANDIBULAR HARDWARE REMOVAL N/A 11/17/2015   Procedure: MANDIBULAR HARDWARE REMOVAL, removal MMF wires and screws;  Surgeon: Osborn Cohoavid Shoemaker, MD;  Location: [facility];  Service: ENT;  Laterality: N/A;  . ORIF MANDIBULAR FRACTURE N/A 09/16/2015   Procedure: OPEN REDUCTION INTERNAL FIXATION (ORIF) MANDIBULAR FRACTURE;  Surgeon: Osborn Cohoavid Shoemaker, MD;  [facility] OR;  Service: ENT;  Laterality: N/A;        Home Medications      Prior to Admission medications   Not on File    Family History Family History  Adopted: Yes  Problem Relation Age of Onset  . Renal Disease Father     Social History Social History   Tobacco Use  . Smoking status: Current Some Day Smoker    Types: Cigarettes  . Smokeless tobacco: Never Used  Substance Use Topics  . Alcohol use: No  . Drug use: No     Allergies   Patient has no known allergies.   Review of Systems Review of Systems  Constitutional: Negative for appetite change and fatigue.  HENT: Negative for congestion, ear discharge and sinus pressure.   Eyes: Negative for discharge.  Respiratory: Negative for cough.   Cardiovascular: Negative for chest pain.  Gastrointestinal: Negative for abdominal pain and diarrhea.  Genitourinary: Negative for frequency and hematuria.  Musculoskeletal: Negative for back pain.  Skin: Negative for rash.  Neurological: Negative for seizures and headaches.  Psychiatric/Behavioral: Negative for hallucinations.     Physical Exam Updated Vital Signs BP 117/78   Pulse 91   Temp 97.9 F (36.6 C) (Oral)   Resp 13   Ht 5\' 5"  (1.651 m)   Wt 52.2 kg   SpO2 98%   BMI 19.14 kg/m   Physical Exam  Constitutional: He is oriented to person, place, and time. He appears well-developed.  HENT:  Head: Normocephalic.  Eyes: Conjunctivae and EOM are normal. No scleral icterus.  Neck: Neck supple. No thyromegaly present.  Cardiovascular: Normal rate and regular rhythm. Exam reveals no gallop and no friction rub.  No murmur heard. Pulmonary/Chest: No stridor. He has no wheezes. He has no rales. He exhibits no tenderness.  Abdominal: He exhibits no distension. There is no tenderness. There is no rebound.  Musculoskeletal: Normal range of motion. He exhibits no edema.  Lymphadenopathy:    He has no cervical adenopathy.  Neurological: He is oriented to person, place, and time. He exhibits normal muscle tone. Coordination normal.   Skin: No rash noted. No erythema.  Psychiatric: He has a normal mood and affect. His behavior is normal.     ED Treatments / Results  Labs (all labs ordered are listed, but only abnormal results are displayed) Labs Reviewed  COMPREHENSIVE METABOLIC PANEL - Abnormal; Notable for the following components:      Result Value   Total Protein 8.2 (*)    All other components within normal limits  ETHANOL - Abnormal; Notable for the following components:   Alcohol, Ethyl (B) 11 (*)    All other components within normal limits  RAPID URINE DRUG SCREEN, HOSP PERFORMED - Abnormal; Notable for the following components:   Opiates POSITIVE (*)    Cocaine POSITIVE (*)    Tetrahydrocannabinol POSITIVE (*)    All other components within normal limits  CBC WITH DIFFERENTIAL/PLATELET    EKG None  Radiology No results found.  Procedures Procedures (including critical care time)  Medications Ordered in ED Medications - No data to display   Initial Impression / Assessment and Plan / ED Course  I have reviewed the triage vital signs and the nursing notes.  Pertinent labs & imaging results that were available during my care of the patient were reviewed by me and considered in my medical decision making (see chart for details).     Patient with accidental drug overdose.  [facility].  Final Clinical Impressions(s) / ED Diagnoses   Final diagnoses:  Accidental drug overdose, initial encounter    ED Discharge Orders    None       Bethann Berkshire, MD 01/30/18 2141

## 2018-01-30 NOTE — ED Triage Notes (Signed)
Pt brought in via [facility], girlfriend found pt unresponsive, 2 of narcan given intranasally. CPR was initiated. A&O upon arrival to ED, pt states he took one 30mg  roxy and had 1 beer. Cooperative and denies any pain.

## 2018-07-24 ENCOUNTER — Emergency Department (HOSPITAL_COMMUNITY)
Admission: EM | Admit: 2018-07-24 | Discharge: 2018-07-24 | Disposition: A | Discharge status: Home / Self Care | Payer: Self-pay | Attending: Emergency Medicine | Admitting: Emergency Medicine

## 2018-07-24 ENCOUNTER — Other Ambulatory Visit: Payer: Self-pay

## 2018-07-24 ENCOUNTER — Encounter (HOSPITAL_COMMUNITY): Payer: Self-pay

## 2018-07-24 DIAGNOSIS — T50901A Poisoning by unspecified drugs, medicaments and biological substances, accidental (unintentional), initial encounter: Secondary | ICD-10-CM

## 2018-07-24 DIAGNOSIS — T424X1A Poisoning by benzodiazepines, accidental (unintentional), initial encounter: Secondary | ICD-10-CM | POA: Insufficient documentation

## 2018-07-24 LAB — CBC WITH DIFFERENTIAL/PLATELET
Abs Immature Granulocytes: 0.04 10*3/uL (ref 0.00–0.07)
Basophils Absolute: 0 10*3/uL (ref 0.0–0.1)
Basophils Relative: 0 %
Eosinophils Absolute: 0.1 10*3/uL (ref 0.0–0.5)
Eosinophils Relative: 1 %
HCT: 46.7 % (ref 39.0–52.0)
Hemoglobin: 15.6 g/dL (ref 13.0–17.0)
Immature Granulocytes: 0 %
Lymphocytes Relative: 10 %
Lymphs Abs: 1.1 10*3/uL (ref 0.7–4.0)
MCH: 30.6 pg (ref 26.0–34.0)
MCHC: 33.4 g/dL (ref 30.0–36.0)
MCV: 91.6 fL (ref 80.0–100.0)
Monocytes Absolute: 0.6 10*3/uL (ref 0.1–1.0)
Monocytes Relative: 6 %
Neutro Abs: 8.6 10*3/uL — ABNORMAL HIGH (ref 1.7–7.7)
Neutrophils Relative %: 83 %
Platelets: 182 10*3/uL (ref 150–400)
RBC: 5.1 MIL/uL (ref 4.22–5.81)
RDW: 11.9 % (ref 11.5–15.5)
WBC: 10.4 10*3/uL (ref 4.0–10.5)
nRBC: 0 % (ref 0.0–0.2)

## 2018-07-24 LAB — COMPREHENSIVE METABOLIC PANEL
ALT: 45 U/L — ABNORMAL HIGH (ref 0–44)
AST: 34 U/L (ref 15–41)
Albumin: 4.8 g/dL (ref 3.5–5.0)
Alkaline Phosphatase: 51 U/L (ref 38–126)
Anion gap: 10 (ref 5–15)
BUN: 8 mg/dL (ref 6–20)
CO2: 27 mmol/L (ref 22–32)
Calcium: 9.2 mg/dL (ref 8.9–10.3)
Chloride: 104 mmol/L (ref 98–111)
Creatinine, Ser: 0.83 mg/dL (ref 0.61–1.24)
GFR calc Af Amer: >60 mL/min (ref >60)
GFR calc non Af Amer: >60 mL/min (ref >60)
Glucose, Bld: 117 mg/dL — ABNORMAL HIGH (ref 70–99)
Potassium: 4.3 mmol/L (ref 3.5–5.1)
Sodium: 141 mmol/L (ref 135–145)
Total Bilirubin: 0.5 mg/dL (ref 0.3–1.2)
Total Protein: 7.4 g/dL (ref 6.5–8.1)

## 2018-07-24 LAB — RAPID URINE DRUG SCREEN, HOSP PERFORMED
Amphetamines: NOT DETECTED
Barbiturates: NOT DETECTED
Benzodiazepines: POSITIVE — AB
Cocaine: NOT DETECTED
Opiates: POSITIVE — AB
Tetrahydrocannabinol: POSITIVE — AB

## 2018-07-24 LAB — SALICYLATE LEVEL: Salicylate Lvl: <7 mg/dL (ref 2.8–30.0)

## 2018-07-24 LAB — ACETAMINOPHEN LEVEL: Acetaminophen (Tylenol), Serum: <10 ug/mL — ABNORMAL LOW (ref 10–30)

## 2018-07-24 NOTE — Discharge Instructions (Signed)
Please refrain from illegal drugs.  The next time you decide to try drugs for recreation remember it may be your last Social event and someone might not be there to save your life.

## 2018-07-24 NOTE — ED Triage Notes (Signed)
Pt was unconscious with agonal respirations. Bagged by EMS. Given 1mg  of Narcan and pt is now conscious alert and oriented. States he took a half a bar of yellow xanax last night and smoked marijuana. States he woke up this morning, then used the bathroom and went back to sleep. He denies taking anything this morning.

## 2018-07-24 NOTE — ED Provider Notes (Addendum)
[facility] EMERGENCY DEPARTMENT Provider Note   CSN: [id number] Arrival date & time: 07/24/18  4098    History   Chief Complaint Chief Complaint  Patient presents with  . Drug Overdose    HPI Anthony May is a 23 y.o. male.     Patient presents after being found unconscious and agonal respirations.  Patient was bagged by EMS and given 1 mg of Narcan, patient improved to normal consciousness and currently has no symptoms.  Patient states he took half a bar of yellow Xanax last night and smoked marijuana.  Patient recalls waking up this morning to use the bathroom went back to sleep.  Patient denies taking anything this morning.  Patient denies any seizure history.  Patient has no cardiac history.  Patient denies self harm.     Past Medical History:  Diagnosis Date  . Medical history non-contributory     Patient Active Problem List   Diagnosis Date Noted  . Accidental drug overdose 11/05/2016  . Hypotension 11/05/2016  . Mandible fracture (HCC) 09/16/2015    Past Surgical History:  Procedure Laterality Date  . CLOSED REDUCTION MANDIBLE WITH MANDIBULOMA N/A 10/07/2015   Procedure: Shelbie Ammons  FIXATION;  Surgeon: Osborn Coho, MD;  Location: [facility] OR;  Service: ENT;  Laterality: N/A;  . MANDIBULAR HARDWARE REMOVAL N/A 11/17/2015   Procedure: MANDIBULAR HARDWARE REMOVAL, removal MMF wires and screws;  Surgeon: Osborn Coho, MD;  Location: [facility];  Service: ENT;  Laterality: N/A;  . ORIF MANDIBULAR FRACTURE N/A 09/16/2015   Procedure: OPEN REDUCTION INTERNAL FIXATION (ORIF) MANDIBULAR FRACTURE;  Surgeon: Osborn Coho, MD;  Location: [facility] OR;  Service: ENT;  Laterality: N/A;        Home Medications    Prior to Admission medications   Not on File    Family History Family History  Adopted: Yes  Problem Relation Age of Onset  . Renal Disease Father     Social History Social History   Tobacco Use  . Smoking status: Current Some Day  Smoker    Types: Cigarettes  . Smokeless tobacco: Never Used  Substance Use Topics  . Alcohol use: Yes    Comment: Occasional   . Drug use: Yes    Types: Marijuana     Allergies   Patient has no known allergies.   Review of Systems Review of Systems  Constitutional: Negative for chills and fever.  HENT: Negative for congestion.   Eyes: Negative for visual disturbance.  Respiratory: Negative for shortness of breath.   Cardiovascular: Negative for chest pain.  Gastrointestinal: Negative for abdominal pain and vomiting.  Genitourinary: Negative for dysuria and flank pain.  Musculoskeletal: Negative for back pain, neck pain and neck stiffness.  Skin: Negative for rash.  Neurological: Negative for light-headedness and headaches.     Physical Exam Updated Vital Signs BP 127/77   Pulse 86   Temp 97.6 F (36.4 C) (Oral)   Resp 12   Ht  (1.651 m)   Wt 52.2 kg   SpO2 100%   BMI 19.14 kg/m   Physical Exam Vitals signs and nursing note reviewed.  Constitutional:      Appearance: He is well-developed.  HENT:     Head: Normocephalic and atraumatic.  Eyes:     General:        Right eye: No discharge.        Left eye: No discharge.     Conjunctiva/sclera: Conjunctivae normal.  Neck:  Musculoskeletal: Normal range of motion and neck supple.     Trachea: No tracheal deviation.  Cardiovascular:     Rate and Rhythm: Normal rate and regular rhythm.  Pulmonary:     Effort: Pulmonary effort is normal.     Breath sounds: Normal breath sounds.  Abdominal:     General: There is no distension.     Palpations: Abdomen is soft.     Tenderness: There is no abdominal tenderness. There is no guarding.  Skin:    General: Skin is warm.     Findings: No rash.  Neurological:     Mental Status: He is alert and oriented to person, place, and time.     Cranial Nerves: No cranial nerve deficit.      ED Treatments / Results  Labs (all labs ordered are listed, but only  abnormal results are displayed) Labs Reviewed  COMPREHENSIVE METABOLIC PANEL - Abnormal; Notable for the following components:      Result Value   Glucose, Bld 117 (*)    ALT 45 (*)    All other components within normal limits  CBC WITH DIFFERENTIAL/PLATELET - Abnormal; Notable for the following components:   Neutro Abs 8.6 (*)    All other components within normal limits  RAPID URINE DRUG SCREEN, HOSP PERFORMED - Abnormal; Notable for the following components:   Opiates POSITIVE (*)    Benzodiazepines POSITIVE (*)    Tetrahydrocannabinol POSITIVE (*)    All other components within normal limits  ACETAMINOPHEN LEVEL - Abnormal; Notable for the following components:   Acetaminophen (Tylenol), Serum <10 (*)    All other components within normal limits  SALICYLATE LEVEL    EKG EKG Interpretation  Date/Time:  Thursday Jul 24 2018 09:20:40 EDT Ventricular Rate:  94 PR Interval:    QRS Duration: 94 QT Interval:  361 QTC Calculation: 452 R Axis:   58 Text Interpretation:  Sinus rhythm Artifact in lead(s) V3 Confirmed by Blane OharaZavitz, Quindarrius [place] [phone]([id number]) on 07/24/2018 1:20:00 PM   Radiology No results found.  Procedures Procedures (including critical care time)  Medications Ordered in ED Medications - No data to display   Initial Impression / Assessment and Plan / ED Course  I have reviewed the triage vital signs and the nursing notes.  Pertinent labs & imaging results that were available during my care of the patient were reviewed by me and considered in my medical decision making (see chart for details).       Patient presents after suspected accidental drug overdose.  The timing of ingestion is not consistent however patient may have taken a long-acting or had something laced in his marijuana.  Plan for screening blood work, EKG reviewed no acute findings, patient will be observed in the ER and then stressed importance of having someone with him throughout today.  We had a long  discussion about risks of recreational/illegal drugs and the important consequences especially with him having a daughter on the way.  Patient understands and says he will stop abusing drugs. Blood work reviewed, opiates/ benzos/ marijuana positive.  Remaining blood work unremarkable.  Rechecked. Well appearing, no sxs, SBP 120. Results and differential diagnosis were discussed with the patient/parent/guardian. Xrays were independently reviewed by myself.  Close follow up outpatient was discussed, comfortable with the plan.   Medications - No data to display  Vitals:   07/24/18 1100 07/24/18 1115 07/24/18 1330 07/24/18 1400  BP: 126/74 116/78 121/74 127/77  Pulse: 92 92 85 86  Resp: Marland Kitchen(!)  9 (!) 8 14 12   Temp:      TempSrc:      SpO2: 100% 99% 100% 100%  Weight:      Height:        Final diagnoses:  Accidental drug overdose, initial encounter     Final Clinical Impressions(s) / ED Diagnoses   Final diagnoses:  Accidental drug overdose, initial encounter    ED Discharge Orders    None       Blane Ohara, MD 07/24/18 1415    Blane Ohara, MD 07/24/18 1427

## 2019-06-02 ENCOUNTER — Ambulatory Visit: Payer: Self-pay | Attending: Internal Medicine

## 2019-06-02 ENCOUNTER — Other Ambulatory Visit: Payer: Self-pay

## 2019-06-02 DIAGNOSIS — Z20822 Contact with and (suspected) exposure to covid-19: Secondary | ICD-10-CM | POA: Insufficient documentation

## 2019-06-03 LAB — NOVEL CORONAVIRUS, NAA: SARS-CoV-2, NAA: NOT DETECTED

## 2023-03-21 DIAGNOSIS — Z1159 Encounter for screening for other viral diseases: Secondary | ICD-10-CM | POA: Diagnosis not present

## 2023-03-21 DIAGNOSIS — R300 Dysuria: Secondary | ICD-10-CM | POA: Diagnosis not present

## 2023-03-21 DIAGNOSIS — Z113 Encounter for screening for infections with a predominantly sexual mode of transmission: Secondary | ICD-10-CM | POA: Diagnosis not present

## 2023-03-21 DIAGNOSIS — Z114 Encounter for screening for human immunodeficiency virus [HIV]: Secondary | ICD-10-CM | POA: Diagnosis not present
